# Patient Record
Sex: Female | Born: 2005 | Race: Black or African American | Hispanic: No | Marital: Single | Smoking: Never smoker
Health system: Southern US, Community
[De-identification: ages and names within clinical notes are randomized; demographics above are authoritative.]

## PROBLEM LIST (undated history)

## (undated) DIAGNOSIS — J45909 Unspecified asthma, uncomplicated: Secondary | ICD-10-CM

## (undated) DIAGNOSIS — E611 Iron deficiency: Secondary | ICD-10-CM

## (undated) DIAGNOSIS — R7989 Other specified abnormal findings of blood chemistry: Secondary | ICD-10-CM

## (undated) DIAGNOSIS — K589 Irritable bowel syndrome without diarrhea: Secondary | ICD-10-CM

## (undated) HISTORY — PX: WISDOM TOOTH EXTRACTION: SHX21

---
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
## 2023-01-10 NOTE — Progress Notes (Signed)
 Subjective Patient ID: Michelle Dominguez is a 17 y.o. female.  HPI St and congestion 3 weeks ago. Improved last 5 days and then ST returned yesterday. No fever. No meds today. Biggest complaint is ST. Eating and drinking okay. + sick contacts.    Review of Systems  Constitutional:  Negative for chills and fever.  HENT:  Positive for congestion and sore throat.   Respiratory:  Positive for cough. Negative for shortness of breath and wheezing.   Cardiovascular:  Negative for chest pain and palpitations.  Gastrointestinal:  Negative for nausea and vomiting.  All other systems reviewed and are negative.       Objective BP 119/83   Pulse 67   Temp 97.8 F (36.6 C)   Resp 16   Ht 160 cm (63)   Wt 66.2 kg (146 lb)   LMP 12/31/2022 (Approximate)   SpO2 98%   BMI 25.86 kg/m  Physical Exam Vitals and nursing note reviewed.  Constitutional:      General: She is not in acute distress.    Appearance: Normal appearance. She is not ill-appearing, toxic-appearing or diaphoretic.  Cardiovascular:     Rate and Rhythm: Normal rate and regular rhythm.     Heart sounds: No murmur heard. Pulmonary:     Effort: Pulmonary effort is normal. No respiratory distress.     Breath sounds: Normal breath sounds. No wheezing.  Musculoskeletal:     Cervical back: Normal range of motion and neck supple.  Skin:    General: Skin is warm.     Capillary Refill: Capillary refill takes less than 2 seconds.  Neurological:     Mental Status: She is alert.         Assessment  [name] was seen today for cough.  Diagnoses and all orders for this visit:  Lower respiratory infection -     azithromycin (ZITHROMAX) 250 mg tablet; Take 2 tablets (500mg ) once today (on Day1), followed by 1 tablet (250mg ) once daily on Day 2 through 5.   Patient appears well in no distress.  Nontoxic-appearing.  Vital signs are stable.  Reasonable to cover with azithromycin due to clinical course and length of symptoms.  Also  recommended daily Claritin in case, symptoms may be due to environmental allergens.  Lots of fluids and return to clinic for new or worsening symptoms.   Return if symptoms worsen or fail to improve.    Assessments and care plan discussed with patient who verbalized understanding.  All questions were answered

## 2023-10-13 ENCOUNTER — Other Ambulatory Visit: Admission: RE | Admit: 2023-10-13 | Discharge: 2023-10-13 | Disposition: A | Discharge status: Home / Self Care

## 2023-10-13 DIAGNOSIS — Z2009 Contact with and (suspected) exposure to other intestinal infectious diseases: Secondary | ICD-10-CM | POA: Insufficient documentation

## 2023-10-20 LAB — PARASITE EXAM, BLOOD
